# Patient Record
Sex: Female | Born: 1970 | Hispanic: No | Marital: Married | Smoking: Never smoker
Health system: Southern US, Community
[De-identification: ages and names within clinical notes are randomized; demographics above are authoritative.]

---

## 2005-05-25 ENCOUNTER — Emergency Department: Payer: Self-pay | Admitting: Emergency Medicine

## 2005-07-08 ENCOUNTER — Emergency Department: Payer: Self-pay | Admitting: Emergency Medicine

## 2006-05-20 ENCOUNTER — Ambulatory Visit: Payer: Self-pay

## 2006-06-11 ENCOUNTER — Ambulatory Visit: Payer: Self-pay

## 2006-10-30 ENCOUNTER — Observation Stay: Payer: Self-pay | Admitting: Certified Nurse Midwife

## 2006-11-28 ENCOUNTER — Observation Stay: Payer: Self-pay

## 2006-12-04 ENCOUNTER — Inpatient Hospital Stay: Payer: Self-pay

## 2008-09-15 ENCOUNTER — Ambulatory Visit: Payer: Self-pay | Admitting: Certified Nurse Midwife

## 2008-11-08 ENCOUNTER — Ambulatory Visit: Payer: Self-pay | Admitting: Certified Nurse Midwife

## 2008-11-12 ENCOUNTER — Observation Stay: Payer: Self-pay | Admitting: Obstetrics and Gynecology

## 2009-02-13 ENCOUNTER — Observation Stay: Payer: Self-pay | Admitting: Obstetrics and Gynecology

## 2009-02-18 ENCOUNTER — Inpatient Hospital Stay: Payer: Self-pay | Admitting: Obstetrics and Gynecology

## 2009-09-04 ENCOUNTER — Emergency Department: Payer: Self-pay | Admitting: Emergency Medicine

## 2012-12-08 ENCOUNTER — Ambulatory Visit: Payer: Self-pay | Admitting: Internal Medicine

## 2013-12-11 ENCOUNTER — Emergency Department: Payer: Self-pay | Admitting: Emergency Medicine

## 2013-12-11 LAB — CBC
HCT: 34.1 % — ABNORMAL LOW (ref 35.0–47.0)
HGB: 11 g/dL — ABNORMAL LOW (ref 12.0–16.0)
MCH: 25 pg — AB (ref 26.0–34.0)
MCHC: 32.4 g/dL (ref 32.0–36.0)
MCV: 77 fL — ABNORMAL LOW (ref 80–100)
PLATELETS: 150 10*3/uL (ref 150–440)
RBC: 4.43 10*6/uL (ref 3.80–5.20)
RDW: 15.4 % — ABNORMAL HIGH (ref 11.5–14.5)
WBC: 9.5 10*3/uL (ref 3.6–11.0)

## 2013-12-11 LAB — BASIC METABOLIC PANEL
Anion Gap: 4 — ABNORMAL LOW (ref 7–16)
BUN: 14 mg/dL (ref 7–18)
CALCIUM: 8.5 mg/dL (ref 8.5–10.1)
CO2: 27 mmol/L (ref 21–32)
Chloride: 108 mmol/L — ABNORMAL HIGH (ref 98–107)
Creatinine: 0.59 mg/dL — ABNORMAL LOW (ref 0.60–1.30)
EGFR (African American): >60
Glucose: 141 mg/dL — ABNORMAL HIGH (ref 65–99)
OSMOLALITY: 280 (ref 275–301)
Potassium: 3.4 mmol/L — ABNORMAL LOW (ref 3.5–5.1)
Sodium: 139 mmol/L (ref 136–145)

## 2018-04-01 ENCOUNTER — Encounter (INDEPENDENT_AMBULATORY_CARE_PROVIDER_SITE_OTHER): Payer: Self-pay

## 2018-04-01 ENCOUNTER — Ambulatory Visit
Admission: RE | Admit: 2018-04-01 | Discharge: 2018-04-01 | Disposition: A | Discharge status: Home / Self Care | Payer: Self-pay | Source: Ambulatory Visit | Attending: Oncology | Admitting: Oncology

## 2018-04-01 ENCOUNTER — Ambulatory Visit: Payer: Self-pay | Attending: Oncology

## 2018-04-01 VITALS — BP 114/77 | HR 74 | Temp 97.8°F | Ht <= 58 in | Wt 136.0 lb

## 2018-04-01 DIAGNOSIS — N63 Unspecified lump in unspecified breast: Secondary | ICD-10-CM

## 2018-04-01 NOTE — Progress Notes (Signed)
  Subjective:     Patient ID: Veronica Vargas, female   DOB: 06-12-1971, 47 y.o.   MRN: [id number]  HPI   Review of Systems     Objective:   Physical Exam  Pulmonary/Chest: Right breast exhibits no inverted nipple, no mass, no nipple discharge, no skin change and no tenderness. Left breast exhibits mass. Left breast exhibits no inverted nipple, no nipple discharge, no skin change and no tenderness. Breasts are asymmetrical.  1 cm. Firm mobile  left breast mass 3 cm. From areola;  Right breast larger than left.         Assessment:     47 year old hispanic patient presents for [facility] clinic visit.  Patient screened, and meets [facility] eligibility.  Patient does not have insurance, Medicare or Medicaid.  Handout given on Affordable Care Act.  Instructed patient on breast self awareness using teach back method.  Patient reports she noticed a large left breast mass, with some erythema, and pain associated in August 2019.  Primary physician put her on cephalexin.  Today palpated a 1 cm, firm mobile mass at 1 o'clock left breast , 3 cm. From areola.  Otherwise normal clinical breast exam.  Risk Assessment    Risk Scores      04/01/2018   Last edited by: Jim Like, RN   5-year risk: 0.4 %   Lifetime risk: 4.9 %            Plan:     Sent for baseline bilateral diagnostic mammogram, and ultrasound.

## 2018-04-01 NOTE — Progress Notes (Signed)
Letter mailed from [facility] to notify of normal mammogram results.  Patient to return in one year for annual screening.  Copy to [facility]. 

## 2019-10-17 ENCOUNTER — Ambulatory Visit: Payer: Self-pay | Attending: Internal Medicine

## 2019-10-17 DIAGNOSIS — Z23 Encounter for immunization: Secondary | ICD-10-CM

## 2019-10-17 NOTE — Progress Notes (Signed)
   Covid-19 Vaccination Clinic  Name:  Veronica Vargas    MRN: [id number] DOB: 03/28/1971  10/17/2019  Ms. Veronica Vargas was observed post Covid-19 immunization for 15 minutes without incident. She was provided with Vaccine Information Sheet and instruction to access the V-Safe system.   Ms. Veronica Vargas was instructed to call 911 with any severe reactions post vaccine: Marland Kitchen Difficulty breathing  . Swelling of face and throat  . A fast heartbeat  . A bad rash all over body  . Dizziness and weakness   Immunizations Administered    Name Date Dose VIS Date Route   Pfizer COVID-19 Vaccine 10/17/2019  2:51 PM 0.3 mL 07/02/2019 Intramuscular   Manufacturer: [employer], [employer]   Lot: [id number]   NDC: [address]-9

## 2019-11-07 ENCOUNTER — Ambulatory Visit: Payer: Self-pay | Attending: Internal Medicine

## 2019-11-07 DIAGNOSIS — Z23 Encounter for immunization: Secondary | ICD-10-CM

## 2019-11-07 NOTE — Progress Notes (Signed)
   Covid-19 Vaccination Clinic  Name:  Veronica Vargas    MRN: [id number] DOB: [birth year]  11/07/2019  Ms. Veronica Vargas was observed post Covid-19 immunization for 15 minutes without incident. She was provided with Vaccine Information Sheet and instruction to access the V-Safe system.   Ms. Veronica Vargas was instructed to call 911 with any severe reactions post vaccine: Marland Kitchen Difficulty breathing  . Swelling of face and throat  . A fast heartbeat  . A bad rash all over body  . Dizziness and weakness   Immunizations Administered    Name Date Dose VIS Date Route   Pfizer COVID-19 Vaccine 11/07/2019  1:47 PM 0.3 mL 07/02/2019 Intramuscular   Manufacturer: [employer], [employer]   Lot: [id number]   NDC: [address]-0

## 2020-10-30 ENCOUNTER — Emergency Department
Admission: EM | Admit: 2020-10-30 | Discharge: 2020-10-30 | Disposition: A | Discharge status: Home / Self Care | Payer: No Typology Code available for payment source | Attending: Emergency Medicine | Admitting: Emergency Medicine

## 2020-10-30 ENCOUNTER — Encounter: Payer: Self-pay | Admitting: Emergency Medicine

## 2020-10-30 ENCOUNTER — Other Ambulatory Visit: Payer: Self-pay

## 2020-10-30 ENCOUNTER — Emergency Department: Payer: No Typology Code available for payment source

## 2020-10-30 DIAGNOSIS — R109 Unspecified abdominal pain: Secondary | ICD-10-CM

## 2020-10-30 DIAGNOSIS — R10A0 Flank pain, unspecified side: Secondary | ICD-10-CM

## 2020-10-30 DIAGNOSIS — S3991XA Unspecified injury of abdomen, initial encounter: Secondary | ICD-10-CM

## 2020-10-30 DIAGNOSIS — Y92410 Unspecified street and highway as the place of occurrence of the external cause: Secondary | ICD-10-CM | POA: Insufficient documentation

## 2020-10-30 DIAGNOSIS — R0789 Other chest pain: Secondary | ICD-10-CM | POA: Insufficient documentation

## 2020-10-30 DIAGNOSIS — M542 Cervicalgia: Secondary | ICD-10-CM | POA: Insufficient documentation

## 2020-10-30 LAB — CBC WITH DIFFERENTIAL/PLATELET
Abs Immature Granulocytes: 0.02 10*3/uL (ref 0.00–0.07)
Basophils Absolute: 0 10*3/uL (ref 0.0–0.1)
Basophils Relative: 0 %
Eosinophils Absolute: 0 10*3/uL (ref 0.0–0.5)
Eosinophils Relative: 0 %
HCT: 41.4 % (ref 36.0–46.0)
Hemoglobin: 13.9 g/dL (ref 12.0–15.0)
Immature Granulocytes: 0 %
Lymphocytes Relative: 17 %
Lymphs Abs: 1.6 10*3/uL (ref 0.7–4.0)
MCH: 30.1 pg (ref 26.0–34.0)
MCHC: 33.6 g/dL (ref 30.0–36.0)
MCV: 89.6 fL (ref 80.0–100.0)
Monocytes Absolute: 0.5 10*3/uL (ref 0.1–1.0)
Monocytes Relative: 5 %
Neutro Abs: 6.8 10*3/uL (ref 1.7–7.7)
Neutrophils Relative %: 78 %
Platelets: 148 10*3/uL — ABNORMAL LOW (ref 150–400)
RBC: 4.62 MIL/uL (ref 3.87–5.11)
RDW: 12.6 % (ref 11.5–15.5)
WBC: 8.9 10*3/uL (ref 4.0–10.5)
nRBC: 0 % (ref 0.0–0.2)

## 2020-10-30 LAB — COMPREHENSIVE METABOLIC PANEL
ALT: 37 U/L (ref 0–44)
AST: 36 U/L (ref 15–41)
Albumin: 4.4 g/dL (ref 3.5–5.0)
Alkaline Phosphatase: 71 U/L (ref 38–126)
Anion gap: 8 (ref 5–15)
BUN: 16 mg/dL (ref 6–20)
CO2: 26 mmol/L (ref 22–32)
Calcium: 9 mg/dL (ref 8.9–10.3)
Chloride: 106 mmol/L (ref 98–111)
Creatinine, Ser: 0.55 mg/dL (ref 0.44–1.00)
GFR, Estimated: >60 mL/min (ref >60)
Glucose, Bld: 112 mg/dL — ABNORMAL HIGH (ref 70–99)
Potassium: 3.4 mmol/L — ABNORMAL LOW (ref 3.5–5.1)
Sodium: 140 mmol/L (ref 135–145)
Total Bilirubin: 1.1 mg/dL (ref 0.3–1.2)
Total Protein: 7.6 g/dL (ref 6.5–8.1)

## 2020-10-30 LAB — POC URINE PREG, ED: Preg Test, Ur: NEGATIVE

## 2020-10-30 MED ORDER — IOHEXOL 300 MG/ML  SOLN
75.0000 mL | Freq: Once | INTRAMUSCULAR | Status: AC | PRN
  Administered 2020-10-30: 75 mL via INTRAVENOUS
  Filled 2020-10-30: qty 75

## 2020-10-30 MED ORDER — LIDOCAINE 5 % EX PTCH: 1.0000 | MEDICATED_PATCH | Freq: Two times a day (BID) | CUTANEOUS | 0 refills | Status: AC

## 2020-10-30 MED ORDER — MORPHINE SULFATE (PF) 4 MG/ML IV SOLN
4.0000 mg | Freq: Once | INTRAVENOUS | Status: AC
  Administered 2020-10-30: 4 mg via INTRAVENOUS
  Filled 2020-10-30: qty 1

## 2020-10-30 MED ORDER — CYCLOBENZAPRINE HCL 5 MG PO TABS: 5.0000 mg | ORAL_TABLET | Freq: Three times a day (TID) | ORAL | 0 refills | Status: AC | PRN
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
## 2020-11-17 ENCOUNTER — Ambulatory Visit
Admission: RE | Admit: 2020-11-17 | Discharge: 2020-11-17 | Disposition: A | Discharge status: Home / Self Care | Payer: Self-pay | Source: Ambulatory Visit | Attending: Chiropractor | Admitting: Chiropractor

## 2020-11-17 ENCOUNTER — Other Ambulatory Visit: Payer: Self-pay | Admitting: Chiropractor

## 2020-11-17 ENCOUNTER — Ambulatory Visit
Admission: RE | Admit: 2020-11-17 | Discharge: 2020-11-17 | Disposition: A | Discharge status: Home / Self Care | Payer: Self-pay | Attending: Chiropractor | Admitting: Chiropractor

## 2020-11-17 DIAGNOSIS — S2243XB Multiple fractures of ribs, bilateral, initial encounter for open fracture: Secondary | ICD-10-CM

## 2021-03-15 ENCOUNTER — Other Ambulatory Visit: Payer: Self-pay | Admitting: Neurology

## 2021-03-15 DIAGNOSIS — G4486 Cervicogenic headache: Secondary | ICD-10-CM

## 2021-03-15 DIAGNOSIS — G44321 Chronic post-traumatic headache, intractable: Secondary | ICD-10-CM

## 2021-06-08 ENCOUNTER — Other Ambulatory Visit: Payer: Self-pay | Admitting: Neurology

## 2021-06-08 DIAGNOSIS — G44321 Chronic post-traumatic headache, intractable: Secondary | ICD-10-CM

## 2021-06-08 DIAGNOSIS — G4486 Cervicogenic headache: Secondary | ICD-10-CM

## 2021-07-05 ENCOUNTER — Other Ambulatory Visit: Payer: Self-pay
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
## 2023-01-16 ENCOUNTER — Encounter: Payer: Self-pay | Admitting: Internal Medicine

## 2023-01-21 ENCOUNTER — Encounter: Payer: Self-pay | Admitting: Family Medicine

## 2023-02-10 ENCOUNTER — Other Ambulatory Visit: Payer: Self-pay

## 2023-02-10 DIAGNOSIS — Z1231 Encounter for screening mammogram for malignant neoplasm of breast: Secondary | ICD-10-CM

## 2023-04-07 ENCOUNTER — Ambulatory Visit: Payer: Self-pay | Attending: Hematology and Oncology | Admitting: Hematology and Oncology

## 2023-04-07 ENCOUNTER — Ambulatory Visit
Admission: RE | Admit: 2023-04-07 | Discharge: 2023-04-07 | Disposition: A | Discharge status: Home / Self Care | Payer: Self-pay | Source: Ambulatory Visit | Attending: Obstetrics and Gynecology | Admitting: Obstetrics and Gynecology

## 2023-04-07 VITALS — BP 109/64 | Wt 132.6 lb

## 2023-04-07 DIAGNOSIS — Z1231 Encounter for screening mammogram for malignant neoplasm of breast: Secondary | ICD-10-CM

## 2023-04-07 NOTE — Patient Instructions (Signed)
Taught Veronica Vargas how to perform BSE and gave educational materials to take home. Patient did not need a Pap smear today due to last Pap smear was in 01/13/23 per patient. Let her know [facility] will cover Pap smears every 5 years unless has a history of abnormal Pap smears. Referred patient to the [facility] for screening mammogram. Appointment scheduled for 04/07/23. Patient aware of appointment and will be there. Let patient know will follow up with her within the next couple weeks with results. Veronica Vargas verbalized understanding.  Pascal Lux, NP 10:40 AM

## 2023-04-07 NOTE — Progress Notes (Signed)
Ms. Veronica Vargas is a 52 y.o. female who presents to [facility] clinic today with no complaints.    Pap Smear: Pap not smear completed today. Last Pap smear was 01/13/23 at [facility] clinic and was abnormal ASCUS/ HPV-. Will repeat in 3 years.  Per patient has no history of an abnormal Pap smear. Last Pap smear result is available in Epic.   Physical exam: Breasts Breasts symmetrical. No skin abnormalities bilateral breasts. No nipple retraction bilateral breasts. No nipple discharge bilateral breasts. No lymphadenopathy. No lumps palpated bilateral breasts.       Pelvic/Bimanual Pap is not indicated today    Smoking History: Patient has never smoked and was not referred to quit line.    Patient Navigation: Patient education provided. Access to services provided for patient through [facility] program. Veronica Vargas interpreter provided. No transportation provided   Colorectal Cancer Screening: Per patient has never had colonoscopy completed No complaints today. FIT test completed and negative this year.    Breast and Cervical Cancer Risk Assessment: Patient does not have family history of breast cancer, known genetic mutations, or radiation treatment to the chest before age 49. Patient does not have history of cervical dysplasia, immunocompromised, or DES exposure in-utero.     A: [facility] exam without pap smear No complaints with benign exam.   P: Referred patient to the [facility] for a screening mammogram. Appointment scheduled 04/07/23.  Veronica Lux, NP 04/07/2023 10:38 AM
